# Patient Record
Sex: Female | Born: 2000 | Hispanic: Yes | Marital: Single | Smoking: Never smoker
Health system: Southern US, Community
[De-identification: ages and names within clinical notes are randomized; demographics above are authoritative.]

## PROBLEM LIST (undated history)

## (undated) DIAGNOSIS — R569 Unspecified convulsions: Secondary | ICD-10-CM

## (undated) DIAGNOSIS — K3580 Unspecified acute appendicitis: Secondary | ICD-10-CM

## (undated) HISTORY — DX: Unspecified convulsions: R56.9

## (undated) HISTORY — DX: Unspecified acute appendicitis: K35.80

## (undated) HISTORY — PX: APPENDECTOMY: SHX54

---
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
## 2022-02-06 DIAGNOSIS — H0015 Chalazion left lower eyelid: Secondary | ICD-10-CM | POA: Diagnosis not present

## 2023-08-06 ENCOUNTER — Ambulatory Visit (HOSPITAL_COMMUNITY)
Admission: EM | Admit: 2023-08-06 | Discharge: 2023-08-06 | Disposition: A | Discharge status: Home / Self Care | Payer: Medicaid Other | Attending: Family Medicine | Admitting: Family Medicine

## 2023-08-06 ENCOUNTER — Encounter (HOSPITAL_COMMUNITY): Payer: Self-pay

## 2023-08-06 DIAGNOSIS — R102 Pelvic and perineal pain: Secondary | ICD-10-CM | POA: Diagnosis not present

## 2023-08-06 LAB — POCT URINALYSIS DIP (MANUAL ENTRY)
Bilirubin, UA: NEGATIVE
Blood, UA: NEGATIVE
Glucose, UA: NEGATIVE mg/dL
Ketones, POC UA: NEGATIVE mg/dL
Leukocytes, UA: NEGATIVE
Nitrite, UA: NEGATIVE
Protein Ur, POC: NEGATIVE mg/dL
Spec Grav, UA: 1.02 (ref 1.010–1.025)
Urobilinogen, UA: 0.2 U/dL
pH, UA: 7 (ref 5.0–8.0)

## 2023-08-06 LAB — POCT URINE PREGNANCY: Preg Test, Ur: NEGATIVE

## 2023-08-06 MED ORDER — IBUPROFEN 800 MG PO TABS: 800.0000 mg | ORAL_TABLET | Freq: Three times a day (TID) | ORAL | 0 refills | Status: AC | PRN

## 2023-08-06 NOTE — Discharge Instructions (Addendum)
Urinalysis was clear  The pregnancy test was negative  Staff will notify you if there is anything positive on the swab.  You can use the QR code/website at the back of the summary paperwork to schedule yourself a new patient appointment with primary care  If you are worsening with more intense pain or other symptoms appearing, please go to the emergency room for further evaluation

## 2023-08-06 NOTE — ED Triage Notes (Signed)
Pt states lower abdominal pain states it is worse when she walks.

## 2023-08-06 NOTE — ED Provider Notes (Signed)
[facility]    CSN: [id number] Arrival date & time: 08/06/23  1846      History   Chief Complaint Chief Complaint  Patient presents with   Abdominal Pain    HPI Brandy Pace is a 22 y.o. female.    Abdominal Pain Here for lower abd pain. Began about 6 hours ago. Is worse with walking. Is constant and achy. No back pain. No dysuria.  LMP 9/22  No n/v/d.   Past Medical History:  Diagnosis Date   Seizures (HCC)    Complex febrile seizure with status epilepticus at 67 months of age, normal head CT, LP, and EEG at that time   Suppurative appendicitis 02/24/2018    Patient Active Problem List   Diagnosis Date Noted   Chalazion of left lower eyelid 07/18/2021   Scoliosis, adolescent acquired 03/31/2018    Past Surgical History:  Procedure Laterality Date   APPENDECTOMY N/A    [employer] 04/16/2020   LAPAROSCOPIC APPENDECTOMY N/A 02/24/2018   Procedure: APPENDECTOMY LAPAROSCOPIC;  Surgeon: Leonia Corona, MD;  Location: [facility];  Service: Pediatrics;  Laterality: N/A;    OB History   No obstetric history on file.      Home Medications    Prior to Admission medications   Medication Sig Start Date End Date Taking? Authorizing Provider  ibuprofen (ADVIL) 800 MG tablet Take 1 tablet (800 mg total) by mouth every 8 (eight) hours as needed (pain). 08/06/23  Yes Zenia Resides, MD    Family History Family History  Problem Relation Age of Onset   Hypertension Mother    Hypertension Father    Diabetes Paternal Grandmother        Type 2    Social History Social History   Tobacco Use   Smoking status: Never   Smokeless tobacco: Never  Vaping Use   Vaping status: Some Days  Substance Use Topics   Alcohol use: Never   Drug use: Never     Allergies   Patient has no known allergies.   Review of Systems Review of Systems  Gastrointestinal:  Positive for abdominal pain.     Physical Exam Triage Vital Signs ED Triage Vitals   Encounter Vitals Group     BP 08/06/23 1906 114/72     Systolic BP Percentile --      Diastolic BP Percentile --      Pulse Rate 08/06/23 1906 64     Resp 08/06/23 1906 16     Temp 08/06/23 1906 98.2 F (36.8 C)     Temp Source 08/06/23 1906 Oral     SpO2 08/06/23 1906 97 %     Weight --      Height --      Head Circumference --      Peak Flow --      Pain Score 08/06/23 1907 8     Pain Loc --      Pain Education --      Exclude from Growth Chart --    No data found.  Updated Vital Signs BP 114/72 (BP Location: Left Arm)   Pulse 64   Temp 98.2 F (36.8 C) (Oral)   Resp 16   LMP 07/21/2023 (Exact Date)   SpO2 97%   Visual Acuity Right Eye Distance:   Left Eye Distance:   Bilateral Distance:    Right Eye Near:   Left Eye Near:    Bilateral Near:     Physical Exam  UC Treatments / Results  Labs (all labs ordered are listed, but only abnormal results are displayed) Labs Reviewed  POCT URINALYSIS DIP (MANUAL ENTRY) - Abnormal; Notable for the following components:      Result Value   Clarity, UA cloudy (*)    All other components within normal limits  POCT URINE PREGNANCY - Normal  CERVICOVAGINAL ANCILLARY ONLY    EKG   Radiology No results found.  Procedures Procedures (including critical care time)  Medications Ordered in UC Medications - No data to display  Initial Impression / Assessment and Plan / UC Course  I have reviewed the triage vital signs and the nursing notes.  Pertinent labs & imaging results that were available during my care of the patient were reviewed by me and considered in my medical decision making (see chart for details).    UPT is negative.  UA is clear.  Vaginal self swab is done, and we will notify of any positives on that and treat per protocol.  Ibuprofen 800 mg is sent in for pain relief.  If she worsens in any way or is not improving she is to go to the emergency room for further evaluation.   she is given  contact information for OB/GYN and instructions on how to set up primary care appointment Final Clinical Impressions(s) / UC Diagnoses   Final diagnoses:  Pelvic pain in female     Discharge Instructions      Urinalysis was clear  The pregnancy test was negative  Staff will notify you if there is anything positive on the swab.  You can use the QR code/website at the back of the summary paperwork to schedule yourself a new patient appointment with primary care  If you are worsening with more intense pain or other symptoms appearing, please go to the emergency room for further evaluation       ED Prescriptions     Medication Sig Dispense Auth. Provider   ibuprofen (ADVIL) 800 MG tablet Take 1 tablet (800 mg total) by mouth every 8 (eight) hours as needed (pain). 21 tablet Shakora Nordquist, Janace Aris, MD      PDMP not reviewed this encounter.   Zenia Resides, MD 08/06/23 [phone]

## 2023-08-07 LAB — CERVICOVAGINAL ANCILLARY ONLY
Bacterial Vaginitis (gardnerella): NEGATIVE
Candida Glabrata: NEGATIVE
Candida Vaginitis: NEGATIVE
Chlamydia: NEGATIVE
Comment: NEGATIVE
Comment: NEGATIVE
Comment: NEGATIVE
Comment: NEGATIVE
Comment: NEGATIVE
Comment: NORMAL
Neisseria Gonorrhea: NEGATIVE
Trichomonas: NEGATIVE

## 2024-01-03 ENCOUNTER — Telehealth: Admitting: Physician Assistant

## 2024-01-03 DIAGNOSIS — K219 Gastro-esophageal reflux disease without esophagitis: Secondary | ICD-10-CM

## 2024-01-03 DIAGNOSIS — R12 Heartburn: Secondary | ICD-10-CM

## 2024-01-03 MED ORDER — SUCRALFATE 1 G PO TABS: 1.0000 g | ORAL_TABLET | Freq: Three times a day (TID) | ORAL | 0 refills | Status: AC

## 2024-01-03 MED ORDER — OMEPRAZOLE 20 MG PO CPDR
20.0000 mg | DELAYED_RELEASE_CAPSULE | Freq: Every day | ORAL | 3 refills | Status: AC
Start: 2024-01-03 — End: ?

## 2024-01-03 NOTE — Progress Notes (Signed)
E-Visit for Heartburn  We are sorry that you are not feeling well.  Here is how we plan to help!  Based on what you shared with me it looks like you most likely have Gastroesophageal Reflux Disease (GERD)  Gastroesophageal reflux disease (GERD) happens when acid from your stomach flows up into the esophagus.  When acid comes in contact with the esophagus, the acid causes sorenss (inflammation) in the esophagus.  Over time, GERD may create small holes (ulcers) in the lining of the esophagus.  I have prescribed Omeprazole 20 mg one by mouth daily until you follow up with a provider.  Your symptoms should improve in the next day or two.  You can use antacids as needed until symptoms resolve.  Call us if your heartburn worsens, you have trouble swallowing, weight loss, spitting up blood or recurrent vomiting.  Home Care: May include lifestyle changes such as weight loss, quitting smoking and alcohol consumption Avoid foods and drinks that make your symptoms worse, such as: Caffeine or alcoholic drinks Chocolate Peppermint or mint flavorings Garlic and onions Spicy foods Citrus fruits, such as oranges, lemons, or limes Tomato-based foods such as sauce, chili, salsa and pizza Fried and fatty foods Avoid lying down for 3 hours prior to your bedtime or prior to taking a nap Eat small, frequent meals instead of a large meals Wear loose-fitting clothing.  Do not wear anything tight around your waist that causes pressure on your stomach. Raise the head of your bed 6 to 8 inches with wood blocks to help you sleep.  Extra pillows will not help.  Seek Help Right Away If: You have pain in your arms, neck, jaw, teeth or back Your pain increases or changes in intensity or duration You develop nausea, vomiting or sweating (diaphoresis) You develop shortness of breath or you faint Your vomit is green, yellow, black or looks like coffee grounds or blood Your stool is red, bloody or black  These  symptoms could be signs of other problems, such as heart disease, gastric bleeding or esophageal bleeding.  Make sure you : Understand these instructions. Will watch your condition. Will get help right away if you are not doing well or get worse.  Your e-visit answers were reviewed by a board certified advanced clinical practitioner to complete your personal care plan.  Depending on the condition, your plan could have included both over the counter or prescription medications.  If there is a problem please reply  once you have received a response from your provider.  Your safety is important to us.  If you have drug allergies check your prescription carefully.    You can use MyChart to ask questions about today's visit, request a non-urgent call back, or ask for a work or school excuse for 24 hours related to this e-Visit. If it has been greater than 24 hours you will need to follow up with your provider, or enter a new e-Visit to address those concerns.  You will get an e-mail in the next two days asking about your experience.  I hope that your e-visit has been valuable and will speed your recovery. Thank you for using e-visits.  I have spent 5 minutes in review of e-visit questionnaire, review and updating patient chart, medical decision making and response to patient.   Erubiel Manasco Z Ward, PA-C      

## 2024-01-03 NOTE — Patient Instructions (Signed)
  Ivar Bury, thank you for joining Tylene Fantasia Ward, PA-C for today's virtual visit.  While this provider is not your primary care provider (PCP), if your PCP is located in our provider database this encounter information will be shared with them immediately following your visit.   A [facility] MyChart account gives you access to today's visit and all your visits, tests, and labs performed at [facility] " click here if you don't have a [facility] MyChart account or go to [email or web address]  Consent: (Patient) Clista Rainford provided verbal consent for this virtual visit at the beginning of the encounter.  Current Medications:  Current Outpatient Medications:    sucralfate (CARAFATE) 1 g tablet, Take 1 tablet (1 g total) by mouth with breakfast, with lunch, and with evening meal for 14 days., Disp: 42 tablet, Rfl: 0   ibuprofen (ADVIL) 800 MG tablet, Take 1 tablet (800 mg total) by mouth every 8 (eight) hours as needed (pain)., Disp: 21 tablet, Rfl: 0   omeprazole (PRILOSEC) 20 MG capsule, Take 1 capsule (20 mg total) by mouth daily., Disp: 30 capsule, Rfl: 3   Medications ordered in this encounter:  Meds ordered this encounter  Medications   sucralfate (CARAFATE) 1 g tablet    Sig: Take 1 tablet (1 g total) by mouth with breakfast, with lunch, and with evening meal for 14 days.    Dispense:  42 tablet    Refill:  0    Supervising Provider:   Merrilee Jansky [[id number]]     *If you need refills on other medications prior to your next appointment, please contact your pharmacy*  Follow-Up: Call back or seek an in-person evaluation if the symptoms worsen or if the condition fails to improve as anticipated.  [facility] [phone]  Other Instructions Drink plenty of water.  Avoid alcohol, caffeine, acidic foods, spicy foods.  Can take carafate as prescribed.  If no improvement make a follow up appointment with Urgent Care or  primary care physician.    If you have been instructed to have an in-person evaluation today at a local Urgent Care facility, please use the link below. It will take you to a list of all of our available [facility], including address, phone number and hours of operation. Please do not delay care.  [facility]  If you or a family member do not have a primary care provider, use the link below to schedule a visit and establish care. When you choose a [facility] primary care physician or advanced practice provider, you gain a long-term partner in health. Find a Primary Care Provider  Learn more about [facility]'s in-office and virtual care options:  - Get Care Now

## 2024-01-03 NOTE — Progress Notes (Signed)
 Virtual Visit Consent   Brandy Pace, you are scheduled for a virtual visit with a [facility] provider today. Just as with appointments in the office, your consent must be obtained to participate. Your consent will be active for this visit and any virtual visit you may have with one of our providers in the next 365 days. If you have a MyChart account, a copy of this consent can be sent to you electronically.  As this is a virtual visit, video technology does not allow for your provider to perform a traditional examination. This may limit your provider's ability to fully assess your condition. If your provider identifies any concerns that need to be evaluated in person or the need to arrange testing (such as labs, EKG, etc.), we will make arrangements to do so. Although advances in technology are sophisticated, we cannot ensure that it will always work on either your end or our end. If the connection with a video visit is poor, the visit may have to be switched to a telephone visit. With either a video or telephone visit, we are not always able to ensure that we have a secure connection.  By engaging in this virtual visit, you consent to the provision of healthcare and authorize for your insurance to be billed (if applicable) for the services provided during this visit. Depending on your insurance coverage, you may receive a charge related to this service.  I need to obtain your verbal consent now. Are you willing to proceed with your visit today? Brandy Pace has provided verbal consent on 01/03/2024 for a virtual visit (video or telephone). Tylene Fantasia Ward, PA-C  Date: 01/03/2024 7:54 PM   Virtual Visit via Video Note   I, Tylene Fantasia Ward, connected with  Brandy Pace  ([id number], 05-20-2001) on 01/03/24 at  7:45 PM EST by a video-enabled telemedicine application and verified that I am speaking with the correct person using two identifiers.  Location: Patient:  Virtual Visit Location Patient: Home Provider: Virtual Visit Location Provider: Home Office   I discussed the limitations of evaluation and management by telemedicine and the availability of in person appointments. The patient expressed understanding and agreed to proceed.    History of Present Illness: Brandy Pace is a 23 y.o. who identifies as a x who was assigned adult at birth, and is being seen today for constant upper abdominal pain that started about one month ago.  Describes it as a burning pain. She reports some reflux.  Pain is worse after eating.  She denies n/v, constipation, dysuria, fever.  She has been taking omeprazole for the last few weeks with minimal improvement.   HPI: HPI  Problems:  Patient Active Problem List   Diagnosis Date Noted   Chalazion of left lower eyelid 07/18/2021   Scoliosis, adolescent acquired 03/31/2018    Allergies: No Known Allergies Medications:  Current Outpatient Medications:    sucralfate (CARAFATE) 1 g tablet, Take 1 tablet (1 g total) by mouth with breakfast, with lunch, and with evening meal for 14 days., Disp: 42 tablet, Rfl: 0   ibuprofen (ADVIL) 800 MG tablet, Take 1 tablet (800 mg total) by mouth every 8 (eight) hours as needed (pain)., Disp: 21 tablet, Rfl: 0   omeprazole (PRILOSEC) 20 MG capsule, Take 1 capsule (20 mg total) by mouth daily., Disp: 30 capsule, Rfl: 3  Observations/Objective: Patient is well-developed, well-nourished in no acute distress.  Resting comfortably at home.  Head is normocephalic, atraumatic.  No labored breathing.  Speech is clear and coherent with logical content.  Patient is alert and oriented at baseline.    Assessment and Plan: 1. Gastroesophageal reflux disease without esophagitis (Primary)  Advised to continue with omeprazole.  Will send in carafate.  Advised follow up in a few weeks.   Follow Up Instructions: I discussed the assessment and treatment plan with the patient. The  patient was provided an opportunity to ask questions and all were answered. The patient agreed with the plan and demonstrated an understanding of the instructions.  A copy of instructions were sent to the patient via MyChart unless otherwise noted below.     The patient was advised to call back or seek an in-person evaluation if the symptoms worsen or if the condition fails to improve as anticipated.    Tylene Fantasia Ward, PA-C

## 2024-01-23 ENCOUNTER — Other Ambulatory Visit: Payer: Self-pay

## 2024-01-23 ENCOUNTER — Emergency Department (HOSPITAL_COMMUNITY)
Admission: EM | Admit: 2024-01-23 | Discharge: 2024-01-23 | Disposition: A | Discharge status: Home / Self Care | Attending: Emergency Medicine | Admitting: Emergency Medicine

## 2024-01-23 DIAGNOSIS — R251 Tremor, unspecified: Secondary | ICD-10-CM | POA: Insufficient documentation

## 2024-01-23 DIAGNOSIS — R110 Nausea: Secondary | ICD-10-CM | POA: Diagnosis not present

## 2024-01-23 DIAGNOSIS — R42 Dizziness and giddiness: Secondary | ICD-10-CM | POA: Diagnosis not present

## 2024-01-23 LAB — BASIC METABOLIC PANEL WITH GFR
Anion gap: 13 (ref 5–15)
BUN: 12 mg/dL (ref 6–20)
CO2: 22 mmol/L (ref 22–32)
Calcium: 9.5 mg/dL (ref 8.9–10.3)
Chloride: 103 mmol/L (ref 98–111)
Creatinine, Ser: 0.63 mg/dL (ref 0.44–1.00)
GFR, Estimated: >60 mL/min (ref >60)
Glucose, Bld: 106 mg/dL — ABNORMAL HIGH (ref 70–99)
Potassium: 3.8 mmol/L (ref 3.5–5.1)
Sodium: 138 mmol/L (ref 135–145)

## 2024-01-23 LAB — URINALYSIS, ROUTINE W REFLEX MICROSCOPIC
Bilirubin Urine: NEGATIVE
Glucose, UA: NEGATIVE mg/dL
Hgb urine dipstick: NEGATIVE
Ketones, ur: NEGATIVE mg/dL
Leukocytes,Ua: NEGATIVE
Nitrite: NEGATIVE
Protein, ur: NEGATIVE mg/dL
Specific Gravity, Urine: 1.008 (ref 1.005–1.030)
pH: 7 (ref 5.0–8.0)

## 2024-01-23 LAB — CBC
HCT: 38.8 % (ref 36.0–46.0)
Hemoglobin: 12.9 g/dL (ref 12.0–15.0)
MCH: 30.8 pg (ref 26.0–34.0)
MCHC: 33.2 g/dL (ref 30.0–36.0)
MCV: 92.6 fL (ref 80.0–100.0)
Platelets: 311 10*3/uL (ref 150–400)
RBC: 4.19 MIL/uL (ref 3.87–5.11)
RDW: 12.5 % (ref 11.5–15.5)
WBC: 8 10*3/uL (ref 4.0–10.5)
nRBC: 0 % (ref 0.0–0.2)

## 2024-01-23 LAB — HCG, SERUM, QUALITATIVE: Preg, Serum: NEGATIVE

## 2024-01-23 LAB — TROPONIN I (HIGH SENSITIVITY): Troponin I (High Sensitivity): <2 ng/L (ref <18)

## 2024-01-23 LAB — CBG MONITORING, ED: Glucose-Capillary: 107 mg/dL — ABNORMAL HIGH (ref 70–99)

## 2024-01-23 MED ORDER — ONDANSETRON 8 MG PO TBDP: 8.0000 mg | ORAL_TABLET | Freq: Three times a day (TID) | ORAL | 0 refills | Status: AC | PRN

## 2024-01-23 MED ORDER — ONDANSETRON 4 MG PO TBDP
4.0000 mg | ORAL_TABLET | Freq: Once | ORAL | Status: AC
  Administered 2024-01-23: 4 mg via ORAL
  Filled 2024-01-23: qty 1

## 2024-01-23 NOTE — ED Provider Notes (Signed)
 [place] EMERGENCY DEPARTMENT AT [facility] Provider Note   CSN: [id number] Arrival date & time: 01/23/24  1119     History  Chief Complaint  Patient presents with   Shaking   Dizziness   Nausea    Brandy Pace is a 23 y.o. adult.   Dizziness    Patient has prior history of appendicitis.  She presents ED with complaints of an episode of suddenly feeling shaky and dizzy.  Patient states she was at work.  She suddenly started to feel very shaky all over.  She started to feel lightheaded and nauseated.  Patient states she felt nauseous but has not vomited.  She has not had any diarrhea.  She denies any sore throat.  No coughing.   Home Medications Prior to Admission medications   Medication Sig Start Date End Date Taking? Authorizing Provider  ondansetron (ZOFRAN-ODT) 8 MG disintegrating tablet Take 1 tablet (8 mg total) by mouth every 8 (eight) hours as needed for nausea or vomiting. 01/23/24  Yes Linwood Dibbles, MD  ibuprofen (ADVIL) 800 MG tablet Take 1 tablet (800 mg total) by mouth every 8 (eight) hours as needed (pain). 08/06/23   Zenia Resides, MD  omeprazole (PRILOSEC) 20 MG capsule Take 1 capsule (20 mg total) by mouth daily. 01/03/24   Ward, Tylene Fantasia, PA-C  sucralfate (CARAFATE) 1 g tablet Take 1 tablet (1 g total) by mouth with breakfast, with lunch, and with evening meal for 14 days. 01/03/24 01/17/24  Ward, Tylene Fantasia, PA-C      Allergies    Patient has no known allergies.    Review of Systems   Review of Systems  Neurological:  Positive for dizziness.    Physical Exam Updated Vital Signs BP 124/69   Pulse 80   Temp 98 F (36.7 C)   Resp 16   Ht 1.524 m (5')   LMP 01/13/2024   SpO2 100%   BMI 21.48 kg/m  Physical Exam Vitals and nursing note reviewed.  Constitutional:      Appearance: Brandy Pace is well-developed. Brandy Pace is not diaphoretic.  HENT:     Head: Normocephalic and atraumatic.     Right Ear: External ear normal.      Left Ear: External ear normal.  Eyes:     General: No scleral icterus.       Right eye: No discharge.        Left eye: No discharge.     Conjunctiva/sclera: Conjunctivae normal.  Neck:     Trachea: No tracheal deviation.  Cardiovascular:     Rate and Rhythm: Normal rate and regular rhythm.  Pulmonary:     Effort: Pulmonary effort is normal. No respiratory distress.     Breath sounds: Normal breath sounds. No stridor. No wheezing or rales.  Abdominal:     General: Bowel sounds are normal. There is no distension.     Palpations: Abdomen is soft.     Tenderness: There is no abdominal tenderness. There is no guarding or rebound.  Musculoskeletal:        General: No tenderness or deformity.     Cervical back: Neck supple.  Skin:    General: Skin is warm and dry.     Findings: No rash.  Neurological:     General: No focal deficit present.     Mental Status: Brandy Pace is alert.     Cranial Nerves: No cranial nerve deficit, dysarthria or facial asymmetry.     Sensory: No  sensory deficit.     Motor: No abnormal muscle tone or seizure activity.     Coordination: Coordination normal.  Psychiatric:        Mood and Affect: Mood normal.     ED Results / Procedures / Treatments   Labs (all labs ordered are listed, but only abnormal results are displayed) Labs Reviewed  BASIC METABOLIC PANEL WITH GFR - Abnormal; Notable for the following components:      Result Value   Glucose, Bld 106 (*)    All other components within normal limits  URINALYSIS, ROUTINE W REFLEX MICROSCOPIC - Abnormal; Notable for the following components:   Color, Urine STRAW (*)    All other components within normal limits  CBG MONITORING, ED - Abnormal; Notable for the following components:   Glucose-Capillary 107 (*)    All other components within normal limits  CBC  HCG, SERUM, QUALITATIVE  TROPONIN I (HIGH SENSITIVITY)    EKG EKG Interpretation Date/Time:  Thursday January 23 2024 12:06:40 EDT Ventricular  Rate:  77 PR Interval:  120 QRS Duration:  84 QT Interval:  386 QTC Calculation: 436 R Axis:   36  Text Interpretation: Normal sinus rhythm nonsepcific t wave changes No previous ECGs available Confirmed by Linwood Dibbles [phone]) on 01/23/2024 3:09:51 PM  Radiology No results found.  Procedures Procedures    Medications Ordered in ED Medications  ondansetron (ZOFRAN-ODT) disintegrating tablet 4 mg (4 mg Oral Given 01/23/24 1234)    ED Course/ Medical Decision Making/ A&P Clinical Course as of 01/23/24 1533  Thu Jan 23, 2024  1508 Basic metabolic panel(!) Metabolic panel normal.  Urinalysis normal.  CBC normal.  Troponin normal. [JK]    Clinical Course User Index [JK] Linwood Dibbles, MD                                 Medical Decision Making Amount and/or Complexity of Data Reviewed Labs: ordered. Decision-making details documented in ED Course.   Patient presented to the ED for evaluation of nausea and shakiness.  Patient states this occurred while she was at work.  Patient's ED workup is reassuring.  CBC and metabolic panel are normal.  No signs of anemia or significant electrolyte abnormalities.  Urinalysis does not suggest infection.  Pregnancy test is negative.  Troponin is negative.  EKG shows nonspecific changes but I doubt any acute cardiac event. Will treat with symptomatic medications.  Monitor for fever or other worsening symptoms. Evaluation and diagnostic testing in the emergency department does not suggest an emergent condition requiring admission or immediate intervention beyond what has been performed at this time.  The patient is safe for discharge and has been instructed to return immediately for worsening symptoms, change in symptoms or any other concerns.        Final Clinical Impression(s) / ED Diagnoses Final diagnoses:  Nausea  Shakiness    Rx / DC Orders ED Discharge Orders          Ordered    ondansetron (ZOFRAN-ODT) 8 MG disintegrating tablet   Every 8 hours PRN        01/23/24 1531              Linwood Dibbles, MD 01/23/24 1533

## 2024-01-23 NOTE — Discharge Instructions (Addendum)
 The medications as needed for nausea.

## 2024-01-23 NOTE — ED Triage Notes (Signed)
 Pt. Stated, I was passing out food and I got to shaking, a little dizzy and some nausea. Sister stated, she did the same thing in December and they said it was anxiety.

## 2024-01-23 NOTE — ED Provider Triage Note (Signed)
 Emergency Medicine Provider Triage Evaluation Note  Brandy Pace , a 23 y.o. adult  was evaluated in triage.  Pt complains of [employer], felt shaking, little dizzy, some.  Sister reported same thing happened December and a reported bibasilar daughter that.  Denies any chest pain, shortness of breath.  Mild coming back she reports last menstrual cycle 2 months ago dysuria.  Review of Systems  Positive: Dizzy, nausea, lightheaded Negative:   Physical Exam  BP 124/77 (BP Location: Right Arm)   Pulse 79   Temp 98.3 F (36.8 C)   Resp 17   Ht 5' (1.524 m)   LMP 01/13/2024   SpO2 100%   BMI 21.48 kg/m  Gen:   Awake, no distress   Resp:  Normal effort  MSK:   Moves extremities without difficulty  Other:    Medical Decision Making  Medically screening exam initiated at 12:25 PM.  Appropriate orders placed.  Brandy Pace was informed that the remainder of the evaluation will be completed by another provider, this initial triage assessment does not replace that evaluation, and the importance of remaining in the ED until their evaluation is complete.  Workup initiated in triage    Olene Floss, [place] 01/23/24 1227

## 2024-05-21 DIAGNOSIS — R0602 Shortness of breath: Secondary | ICD-10-CM | POA: Diagnosis not present

## 2024-05-21 DIAGNOSIS — Z0000 Encounter for general adult medical examination without abnormal findings: Secondary | ICD-10-CM | POA: Diagnosis not present

## 2024-05-31 DIAGNOSIS — Z13220 Encounter for screening for lipoid disorders: Secondary | ICD-10-CM | POA: Diagnosis not present

## 2024-05-31 DIAGNOSIS — Z79899 Other long term (current) drug therapy: Secondary | ICD-10-CM | POA: Diagnosis not present

## 2024-05-31 DIAGNOSIS — R5383 Other fatigue: Secondary | ICD-10-CM | POA: Diagnosis not present

## 2024-06-11 DIAGNOSIS — R0602 Shortness of breath: Secondary | ICD-10-CM | POA: Diagnosis not present

## 2024-06-20 DIAGNOSIS — I479 Paroxysmal tachycardia, unspecified: Secondary | ICD-10-CM | POA: Diagnosis not present

## 2024-06-20 DIAGNOSIS — R0602 Shortness of breath: Secondary | ICD-10-CM | POA: Diagnosis not present

## 2024-08-04 DIAGNOSIS — R0602 Shortness of breath: Secondary | ICD-10-CM | POA: Diagnosis not present

## 2024-08-04 DIAGNOSIS — I479 Paroxysmal tachycardia, unspecified: Secondary | ICD-10-CM | POA: Diagnosis not present
